# Patient Record
Sex: Female | Born: 1970 | Race: White | Hispanic: No | Marital: Married
Health system: Southern US, Community
[De-identification: ages and names within clinical notes are randomized; demographics above are authoritative.]

---

## 2007-08-01 ENCOUNTER — Inpatient Hospital Stay (HOSPITAL_COMMUNITY): Admission: AD | Admit: 2007-08-01 | Discharge: 2007-08-01 | Payer: Self-pay | Admitting: Obstetrics and Gynecology

## 2007-08-10 ENCOUNTER — Encounter (INDEPENDENT_AMBULATORY_CARE_PROVIDER_SITE_OTHER): Payer: Self-pay | Admitting: Obstetrics and Gynecology

## 2007-08-10 ENCOUNTER — Inpatient Hospital Stay (HOSPITAL_COMMUNITY): Admission: AD | Admit: 2007-08-10 | Discharge: 2007-08-13 | Payer: Self-pay | Admitting: Obstetrics and Gynecology

## 2007-08-15 ENCOUNTER — Inpatient Hospital Stay (HOSPITAL_COMMUNITY): Admission: AD | Admit: 2007-08-15 | Discharge: 2007-08-15 | Payer: Self-pay | Admitting: Obstetrics and Gynecology

## 2008-10-14 ENCOUNTER — Encounter: Admission: RE | Admit: 2008-10-14 | Discharge: 2008-10-14 | Payer: Self-pay | Admitting: Obstetrics and Gynecology

## 2008-12-27 ENCOUNTER — Encounter: Admission: RE | Admit: 2008-12-27 | Discharge: 2008-12-27 | Payer: Self-pay | Admitting: Endocrinology

## 2009-12-29 ENCOUNTER — Encounter: Admission: RE | Admit: 2009-12-29 | Discharge: 2009-12-29 | Payer: Self-pay | Admitting: Endocrinology

## 2010-01-07 ENCOUNTER — Encounter: Admission: RE | Admit: 2010-01-07 | Discharge: 2010-01-07 | Payer: Self-pay | Admitting: Obstetrics and Gynecology

## 2010-05-08 ENCOUNTER — Ambulatory Visit (HOSPITAL_COMMUNITY): Admission: RE | Admit: 2010-05-08 | Discharge: 2010-05-08 | Payer: Self-pay | Admitting: Gastroenterology

## 2010-09-20 ENCOUNTER — Encounter: Payer: Self-pay | Admitting: Gastroenterology

## 2010-11-26 ENCOUNTER — Other Ambulatory Visit: Payer: Self-pay | Admitting: Endocrinology

## 2010-11-26 DIAGNOSIS — E049 Nontoxic goiter, unspecified: Secondary | ICD-10-CM

## 2010-12-14 ENCOUNTER — Other Ambulatory Visit: Payer: Self-pay

## 2010-12-22 ENCOUNTER — Other Ambulatory Visit: Payer: Self-pay | Admitting: Obstetrics and Gynecology

## 2010-12-22 DIAGNOSIS — Z1231 Encounter for screening mammogram for malignant neoplasm of breast: Secondary | ICD-10-CM

## 2010-12-29 ENCOUNTER — Ambulatory Visit
Admission: RE | Admit: 2010-12-29 | Discharge: 2010-12-29 | Disposition: A | Discharge status: Home / Self Care | Payer: 59 | Source: Ambulatory Visit | Attending: Endocrinology | Admitting: Endocrinology

## 2010-12-29 DIAGNOSIS — E049 Nontoxic goiter, unspecified: Secondary | ICD-10-CM

## 2011-01-12 NOTE — Discharge Summary (Signed)
[name], Alicia Carter NO.:  [id number]   MEDICAL RECORD NO.:  [id number]          PATIENT TYPE:  INP   LOCATION:  9146                          FACILITY:  [facility]   PHYSICIAN:  Guy Sandifer. Henderson Cloud, M.D. DATE OF BIRTH:  1970/09/03   DATE OF ADMISSION:  08/10/2007  DATE OF DISCHARGE:  08/13/2007                               DISCHARGE SUMMARY   ADMITTING DIAGNOSES:  1. Intrauterine pregnancy at thirty-six and four-sevenths weeks.  2. Severe preeclampsia.   DISCHARGE DIAGNOSES:  1. Intrauterine pregnancy at thirty-six and four-sevenths weeks.  2. Severe preeclampsia.   PROCEDURE:  On August 10, 2007, repeat low transverse cesarean section  and bilateral tubal ligation.   REASON FOR ADMISSION:  This patient is a 40 year old married white  female, G2 P1, at 1 and 4/7 weeks who is noted to have increased  swelling and blood pressures.  Liver functions were also elevated.  She  was then taken to the operating room.   HOSPITAL COURSE:  The patient was taken to the operating room to undergo  the above procedure.  It was productive of a viable female infant, Apgars  of 9 and 10 at one and five minutes respectively.  On the first  postoperative day, the patient remains on magnesium sulfate.  She has  good pain relief with no central nervous system changes and no  epigastric pain.  Blood pressures are 120s-140s/70s-80s with good urine  output.  Laboratories revealed a mildly elevated AST of 50 and ALT of  40.  Hemoglobin is 7.4.  Repeat lab work later in the day reveals a  stable hemoglobin and slightly improved liver functions.  She was  continued on magnesium through the night and by the next morning is  complaining of hot flashes and some general weakness with the magnesium.  The magnesium is discontinued and the patient feels remarkably better.  She is tolerating a regular diet and passing flatus.  She had some  labile blood pressures up and down through the night and  was treated one  time with labetalol IV.  Later in the morning on August 12, 2007,  blood pressures are 130s to 150s/80s to 90s.  Lungs remain clear,  hemoglobin is 6.6, platelets are stable at 269 and liver functions  continue to improve slightly at an AST of 38 and an ALT of 34.  ON the  day of discharge she is feeling fine.  Blood pressures are 120s to  160s/70s and 80s.  Lungs remain clear.  Her incision is healing well.  Hemoglobin is stable at 6.4, platelets are 297,000.  AST is mildly  elevated at 41 and ALT is normal at 26.   CONDITION ON DISCHARGE:  Good.   DIET:  Regular as tolerated.   ACTIVITY:  1. No lifting.  2. No operation of automobiles.  3. No vaginal entry.  4. Increased rest is discussed.  5. Signs and symptoms of preeclampsia are reviewed.  She is to call      the office for problems including, but not limited to, heavy      vaginal bleeding, temperature  of 101 degrees or increasing pain or      persistent nausea or vomiting.   MEDICATIONS:  1. Tandem iron supplement, #60, one p.o. b.i.d.  2. Darvocet-N 100, #30, 1-2 p.o. q.6 hours p.r.n.  3. Motrin 600 mg, #30, no refills, one p.o. q.6h. p.r.n.   FOLLOWUP:  In the office in 5 days.      Guy Sandifer Henderson Cloud, M.D.  Electronically Signed     JET/MEDQ  D:  08/13/2007  T:  08/14/2007  Job:  [id number]

## 2011-01-12 NOTE — Op Note (Signed)
Alicia Carter, Alicia Carter             ACCOUNT NO.:  [id number]   MEDICAL RECORD NO.:  [id number]          PATIENT TYPE:  INP   LOCATION:  9373                          FACILITY:  [facility]   PHYSICIAN:  Michelle L. Grewal, M.D.DATE OF BIRTH:  Jun 15, 1971   DATE OF PROCEDURE:  08/10/2007  DATE OF DISCHARGE:                               OPERATIVE REPORT   PREOPERATIVE DIAGNOSES:  1. Intrauterine pregnancy 36 and 4.  2. Previous cesarean section.  3. Severe preeclampsia.  4. Desires permanent sterilization.   POSTOPERATIVE DIAGNOSES:  1. Intrauterine pregnancy 36 and 4.  2. Previous cesarean section.  3. Severe preeclampsia.  4. Desires permanent sterilization.   PROCEDURE:  Repeat low transverse cesarean section, and modified [place]  bilateral tubal ligation.   SURGEON:  Michelle L. Vincente Poli, M.D.   ANESTHESIA:  Spinal.   FINDINGS:  Female infant in cephalic presentation, Apgars 9 at one minute  and 10 at five minutes.   PATHOLOGY:  Fallopian tube segments and placenta.   ESTIMATED BLOOD LOSS:  500 mL.   DRAINS:  Foley.   PROCEDURE:  The patient was taken to the operating room.  Her spinal was  placed and found to be adequate.  This was placed by Dr. Tacy Dura.  She  was prepped and draped, and a Foley catheter was inserted and draining  clear urine.  A low transverse incision was made, carried down to the  fascia, fascia scored in the midline and extended laterally.  The rectus  muscles were separated in the midline.  The peritoneum was entered  bluntly, and the peritoneal incision was then stretched.  The bladder  blade was inserted, and the lower uterine segment was identified, and  the bladder flap was created sharply and then digitally.  The bladder  blade was readjusted.  A low transverse incision was made in the uterus.  The baby was in cephalic presentation and was delivered easily with one  gentle pull of the vacuum with no pop-off.  The baby was a female infant,  vigorous on  the abdomen, with Apgars 9 at one minute and 10 at five  minutes.  The cord had been clamped and cut and was handed to the  waiting neonatal team, and the baby was subsequently taken to newborn  nursery.  The placenta was manually removed after cord blood was  obtained, and the placenta was noted be normal, intact, with three-  vessel cord.  Antibiotics and Pitocin had been given.  The uterus was  exteriorized and cleared of all clots and debris. The uterus became  firm, and the incision was closed in one layer using 0 chromic in  continuous running-locked stitch.  Hemostasis was excellent.  At this  point, the patient had wanted to have permanent sterilization and had  been counseled prior to this in regards to that and had consented to a  tubal ligation.  At this point, we identified each fallopian tube  segment out to its fimbriated end on each tubal segment, lifted up the  midportion of the tube using a Babcock clamp, tied off about a 2-3 cm  knuckle using plain gut x2, and excised each segment of fallopian tube  with Metzenbaums.  Hemostasis was excellent.  The uterus was returned to  the abdomen.  Irrigation was performed.  Hemostasis was again noted.  The rectus muscles and peritoneum were closed using 0 Vicryl.  The  fascia was closed using 0 Vicryl in a running stitch.  After irrigation  of the subcutaneous layer, the skin was closed with staples.  All  sponge, lap, and instrument counts were correct x2.  The patient went to  the recovery room in stable condition.      Michelle L. Vincente Poli, M.D.  Electronically Signed     MLG/MEDQ  D:  08/10/2007  T:  08/11/2007  Job:  [id number]

## 2011-01-13 ENCOUNTER — Ambulatory Visit: Payer: Self-pay

## 2011-02-08 ENCOUNTER — Ambulatory Visit: Payer: 59

## 2011-05-10 ENCOUNTER — Ambulatory Visit
Admission: RE | Admit: 2011-05-10 | Discharge: 2011-05-10 | Disposition: A | Discharge status: Home / Self Care | Payer: 59 | Source: Ambulatory Visit | Attending: Obstetrics and Gynecology | Admitting: Obstetrics and Gynecology

## 2011-05-10 DIAGNOSIS — Z1231 Encounter for screening mammogram for malignant neoplasm of breast: Secondary | ICD-10-CM

## 2011-06-07 LAB — CBC
HCT: 19.3 — ABNORMAL LOW
HCT: 21.4 — ABNORMAL LOW
HCT: 22.3 — ABNORMAL LOW
HCT: 30.7 — ABNORMAL LOW
HCT: 31 — ABNORMAL LOW
Hemoglobin: 10.5 — ABNORMAL LOW
Hemoglobin: 6.6 — CL
Hemoglobin: 7.4 — CL
MCHC: 33.7
MCHC: 33.8
MCHC: 34.1
MCV: 77.5 — ABNORMAL LOW
MCV: 77.6 — ABNORMAL LOW
MCV: 78.2
MCV: 78.9
Platelets: 275
Platelets: 283
Platelets: 297
RBC: 2.51 — ABNORMAL LOW
RBC: 2.85 — ABNORMAL LOW
RBC: 3.95
RDW: 14.5
RDW: 14.5
RDW: 14.6
RDW: 15.1
RDW: 15.1
WBC: 12.1 — ABNORMAL HIGH
WBC: 13.8 — ABNORMAL HIGH
WBC: 15.4 — ABNORMAL HIGH
WBC: 16.5 — ABNORMAL HIGH
WBC: 9.6

## 2011-06-07 LAB — COMPREHENSIVE METABOLIC PANEL
ALT: 31
ALT: 34
ALT: 47 — ABNORMAL HIGH
AST: 28
AST: 41 — ABNORMAL HIGH
AST: 53 — ABNORMAL HIGH
Albumin: 1.9 — ABNORMAL LOW
Albumin: 2.1 — ABNORMAL LOW
Albumin: 2.5 — ABNORMAL LOW
Albumin: 2.6 — ABNORMAL LOW
Alkaline Phosphatase: 123 — ABNORMAL HIGH
Alkaline Phosphatase: 167 — ABNORMAL HIGH
Alkaline Phosphatase: 183 — ABNORMAL HIGH
BUN: 5 — ABNORMAL LOW
BUN: 6
CO2: 25
CO2: 26
Calcium: 5.8 — CL
Calcium: 6.1 — CL
Chloride: 102
Chloride: 106
Chloride: 107
Creatinine, Ser: 0.91
Creatinine, Ser: 0.94
Creatinine, Ser: 0.95
GFR calc Af Amer: >60
GFR calc Af Amer: >60
GFR calc Af Amer: >60
GFR calc Af Amer: >60
GFR calc non Af Amer: >60
GFR calc non Af Amer: >60
GFR calc non Af Amer: >60
Glucose, Bld: 101 — ABNORMAL HIGH
Glucose, Bld: 130 — ABNORMAL HIGH
Glucose, Bld: 138 — ABNORMAL HIGH
Glucose, Bld: 144 — ABNORMAL HIGH
Potassium: 4
Potassium: 4
Potassium: 4
Potassium: 4.3
Sodium: 133 — ABNORMAL LOW
Sodium: 137
Sodium: 137
Sodium: 137
Total Bilirubin: 0.4
Total Bilirubin: 0.4
Total Bilirubin: 0.5
Total Bilirubin: 0.6
Total Protein: 4.5 — ABNORMAL LOW
Total Protein: 4.6 — ABNORMAL LOW
Total Protein: 5 — ABNORMAL LOW

## 2011-06-07 LAB — LACTATE DEHYDROGENASE
LDH: 321 — ABNORMAL HIGH
LDH: 329 — ABNORMAL HIGH
LDH: 344 — ABNORMAL HIGH

## 2011-06-07 LAB — URIC ACID
Uric Acid, Serum: 6.6
Uric Acid, Serum: 6.9
Uric Acid, Serum: 7.4 — ABNORMAL HIGH

## 2011-06-07 LAB — MAGNESIUM
Magnesium: 3.3 — ABNORMAL HIGH
Magnesium: 6 — ABNORMAL HIGH
Magnesium: 6.2

## 2011-10-06 ENCOUNTER — Other Ambulatory Visit: Payer: Self-pay | Admitting: Dermatology

## 2012-04-06 ENCOUNTER — Other Ambulatory Visit: Payer: Self-pay | Admitting: Obstetrics and Gynecology

## 2012-04-06 DIAGNOSIS — N644 Mastodynia: Secondary | ICD-10-CM

## 2012-04-26 ENCOUNTER — Other Ambulatory Visit: Payer: Self-pay | Admitting: Endocrinology

## 2012-04-26 DIAGNOSIS — E049 Nontoxic goiter, unspecified: Secondary | ICD-10-CM

## 2012-05-03 ENCOUNTER — Other Ambulatory Visit: Payer: 59

## 2012-05-09 ENCOUNTER — Ambulatory Visit
Admission: RE | Admit: 2012-05-09 | Discharge: 2012-05-09 | Disposition: A | Discharge status: Home / Self Care | Payer: 59 | Source: Ambulatory Visit | Attending: Obstetrics and Gynecology | Admitting: Obstetrics and Gynecology

## 2012-05-09 DIAGNOSIS — N644 Mastodynia: Secondary | ICD-10-CM

## 2012-05-10 ENCOUNTER — Ambulatory Visit
Admission: RE | Admit: 2012-05-10 | Discharge: 2012-05-10 | Disposition: A | Discharge status: Home / Self Care | Payer: 59 | Source: Ambulatory Visit | Attending: Endocrinology | Admitting: Endocrinology

## 2012-05-10 DIAGNOSIS — E049 Nontoxic goiter, unspecified: Secondary | ICD-10-CM

## 2012-08-17 ENCOUNTER — Other Ambulatory Visit: Payer: Self-pay

## 2012-11-02 ENCOUNTER — Other Ambulatory Visit: Payer: Self-pay | Admitting: Endocrinology

## 2012-11-02 DIAGNOSIS — E049 Nontoxic goiter, unspecified: Secondary | ICD-10-CM

## 2012-12-29 ENCOUNTER — Other Ambulatory Visit: Payer: Self-pay | Admitting: Obstetrics and Gynecology

## 2013-03-27 ENCOUNTER — Other Ambulatory Visit: Payer: Self-pay

## 2013-03-27 DIAGNOSIS — Z1231 Encounter for screening mammogram for malignant neoplasm of breast: Secondary | ICD-10-CM

## 2013-05-04 ENCOUNTER — Ambulatory Visit
Admission: RE | Admit: 2013-05-04 | Discharge: 2013-05-04 | Disposition: A | Discharge status: Home / Self Care | Payer: BC Managed Care – PPO | Source: Ambulatory Visit

## 2013-05-04 ENCOUNTER — Ambulatory Visit: Payer: 59

## 2013-05-04 DIAGNOSIS — Z1231 Encounter for screening mammogram for malignant neoplasm of breast: Secondary | ICD-10-CM

## 2013-06-04 ENCOUNTER — Ambulatory Visit
Admission: RE | Admit: 2013-06-04 | Discharge: 2013-06-04 | Disposition: A | Discharge status: Home / Self Care | Payer: BC Managed Care – PPO | Source: Ambulatory Visit | Attending: Endocrinology | Admitting: Endocrinology

## 2013-06-04 DIAGNOSIS — E049 Nontoxic goiter, unspecified: Secondary | ICD-10-CM

## 2013-06-18 IMAGING — US US SOFT TISSUE HEAD/NECK
1 series · 14 of 25 positions shown · non-contrast
Comparison: Ultrasound of the thyroid of 12/29/2010

CLINICAL DATA: Follow up of thyroid goiter

THYROID ULTRASOUND
TECHNIQUE: Ultrasound examination of the thyroid gland and adjacent
soft tissues was performed.

[Series 1: us soft tissue head/neck · 0.07mm/px · 14 of 64 slices shown]
[im 1/64]
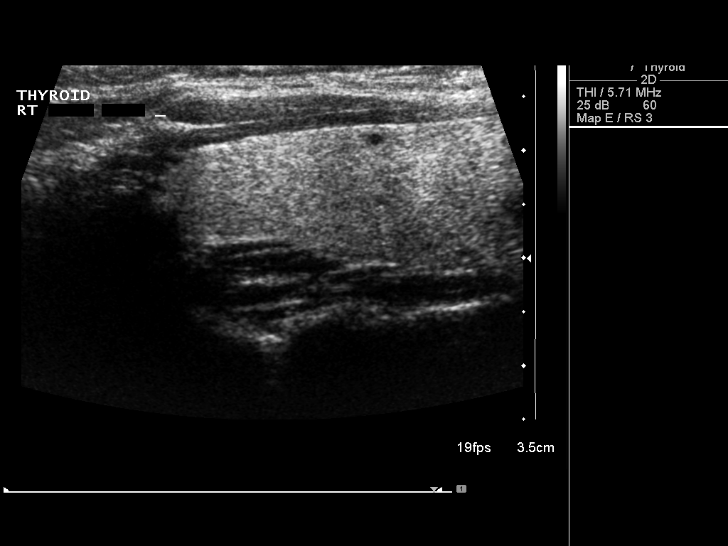
[im 6/64]
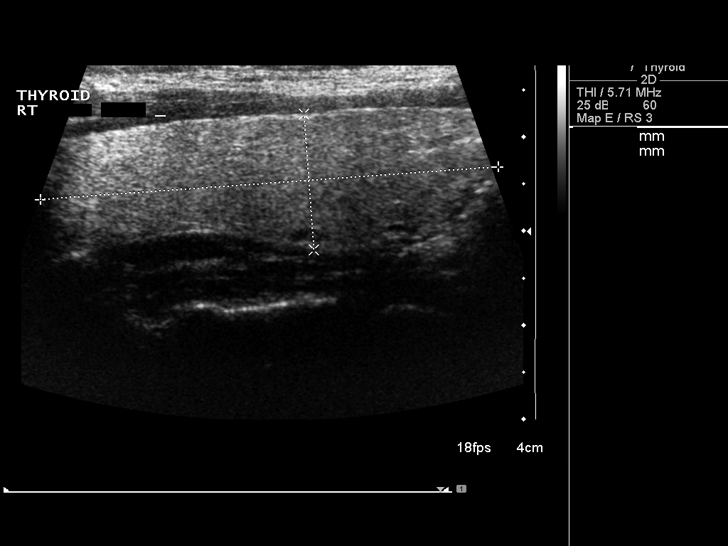
[im 11/64]
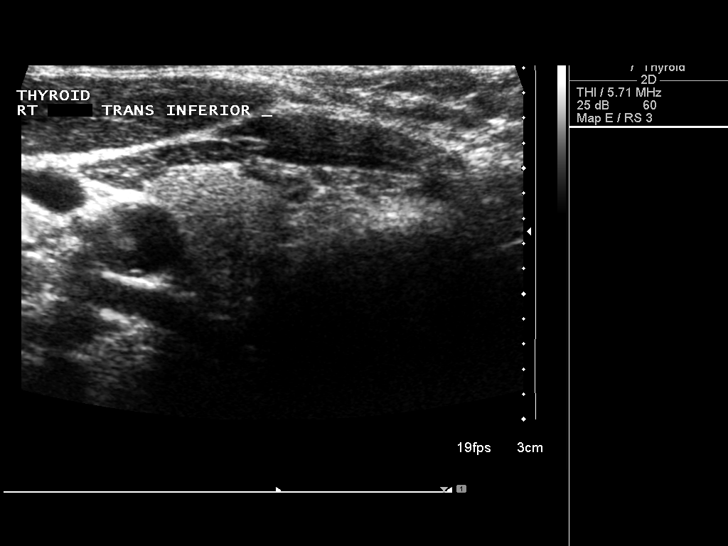
[im 16/64]
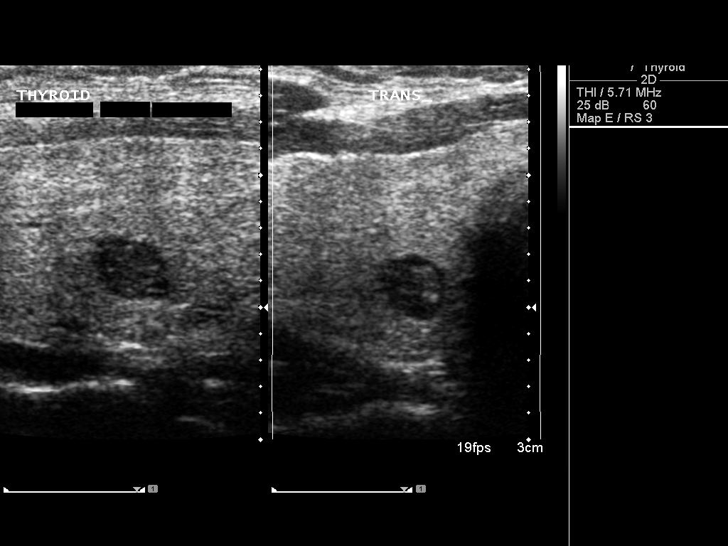
[im 22/64]
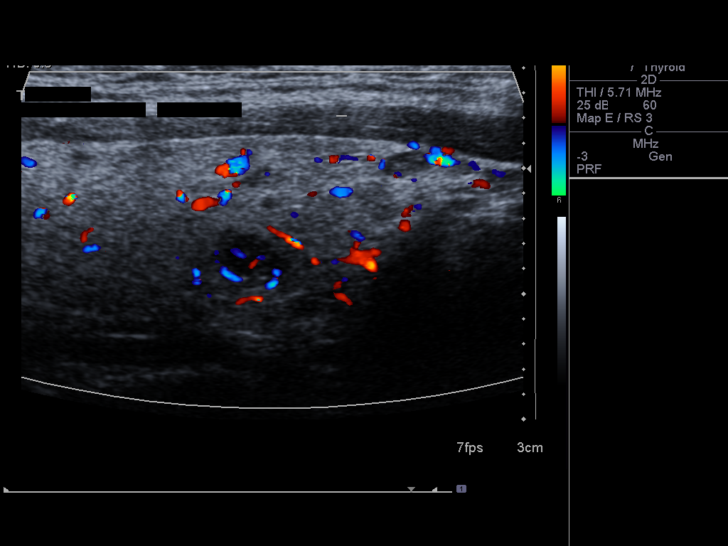
[im 24/64]
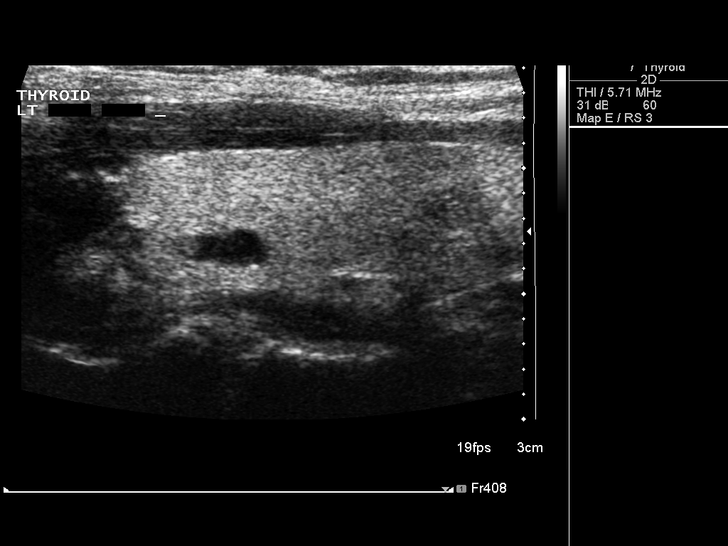
[im 29/64]
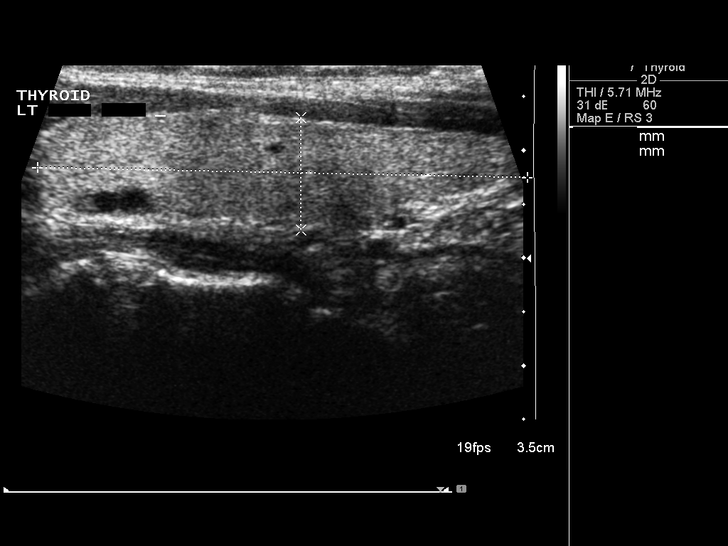
[im 35/64]
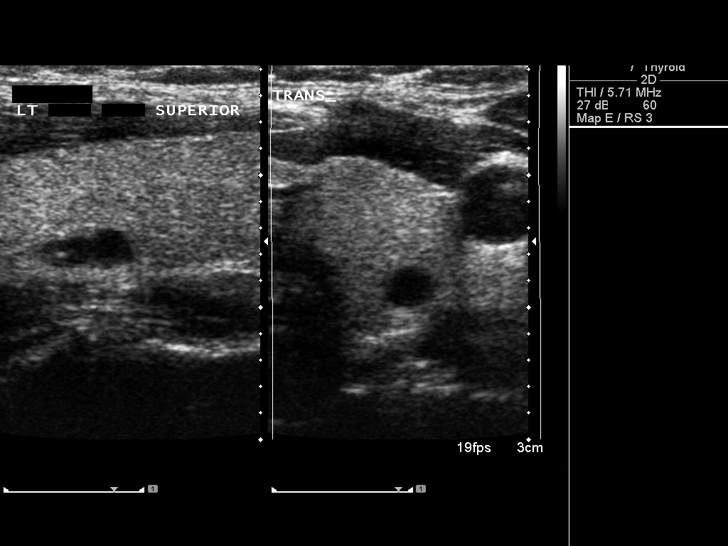
[im 40/64]
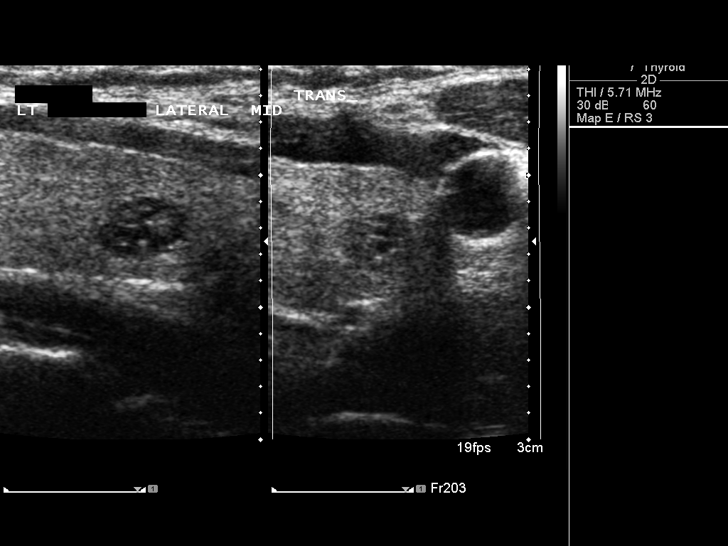
[im 43/64]
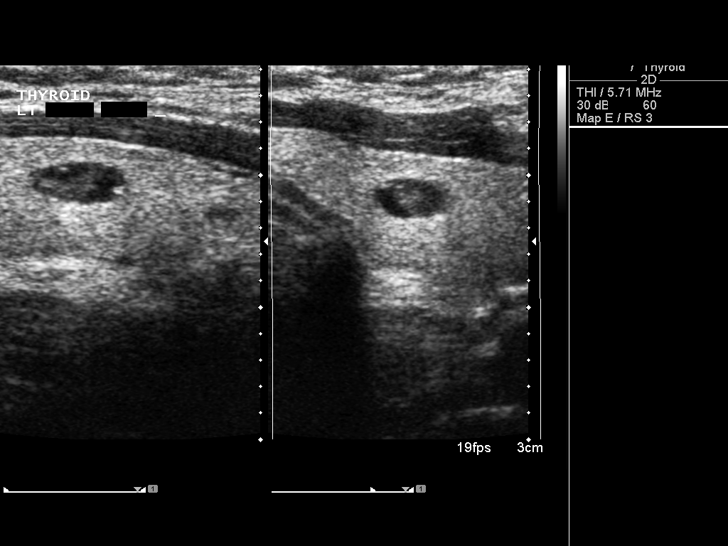
[im 48/64]
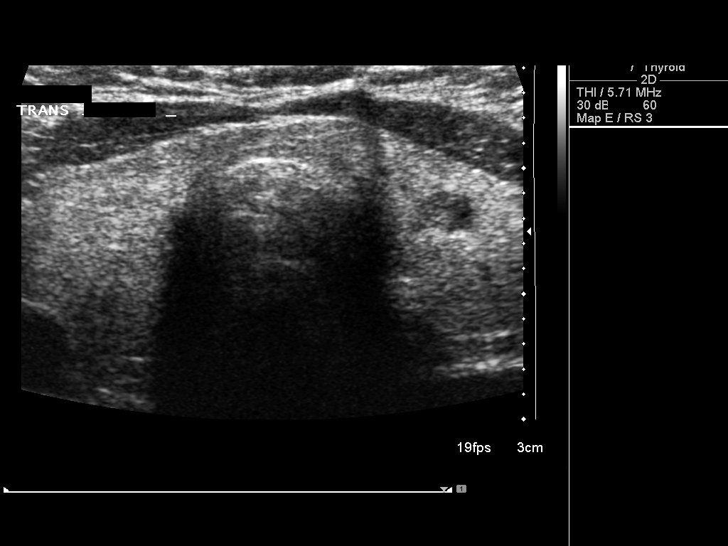
[im 53/64]
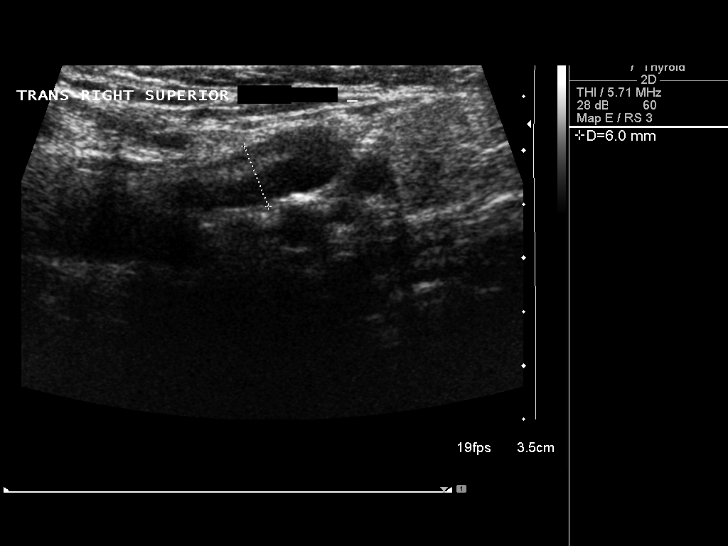
[im 58/64]
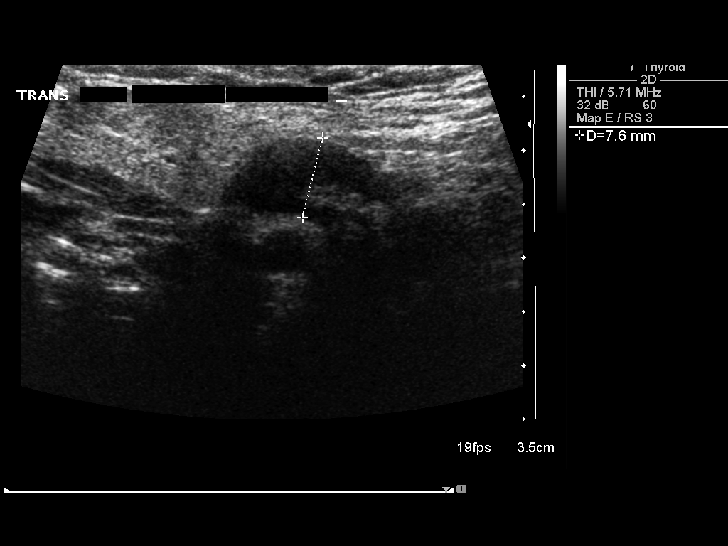
[im 64/64]
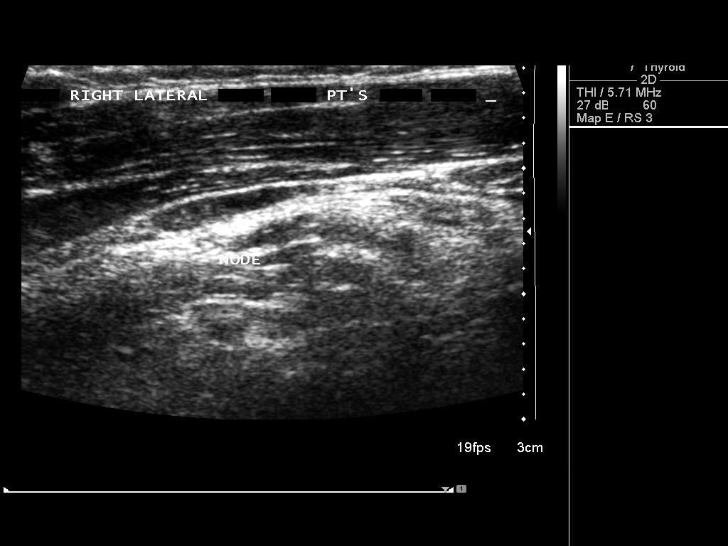

[14 of 25 positions shown; findings below may reference images not displayed]

FINDINGS: Right thyroid lobe:  5.4 x 1.4 x 1.8 cm.  (Previously 5.1 x 1.6 x
1.8 cm).
Left thyroid lobe:  4.8 x 1.1 x 1.9 cm.  (Previously 5.3 x 1.2 x
2.0 cm).
Isthmus:  2 mm in thickness.

Focal nodules:  The echogenicity of the thyroid gland is relatively
homogeneous.  However multiple nodules, primarily hypoechoic are
again noted bilaterally.  A solid nodule in the lower pole on the
left measures 0.7 x 0.5 x 0.6 cm compared to prior measurements of
0.7 x 0.5 x 0.7 cm with a few microcalcifications present.

Lymphadenopathy:  No adenopathy is seen.

Imaging over the area of the patient's palpable area in the right
neck was performed.  Only small lymph nodes are present with no
mass or cyst evident.
IMPRESSION: Stable thyroid nodules, the majority of which are hypoechoic.

## 2013-08-06 ENCOUNTER — Other Ambulatory Visit: Payer: 59

## 2013-08-13 ENCOUNTER — Other Ambulatory Visit: Payer: Self-pay | Admitting: Endocrinology

## 2013-08-13 DIAGNOSIS — E049 Nontoxic goiter, unspecified: Secondary | ICD-10-CM

## 2014-01-01 ENCOUNTER — Other Ambulatory Visit: Payer: Self-pay | Admitting: Obstetrics and Gynecology

## 2014-04-01 ENCOUNTER — Other Ambulatory Visit: Payer: Self-pay

## 2014-04-01 DIAGNOSIS — Z1231 Encounter for screening mammogram for malignant neoplasm of breast: Secondary | ICD-10-CM

## 2014-05-13 ENCOUNTER — Ambulatory Visit
Admission: RE | Admit: 2014-05-13 | Discharge: 2014-05-13 | Disposition: A | Discharge status: Home / Self Care | Payer: BC Managed Care – PPO | Source: Ambulatory Visit

## 2014-05-13 DIAGNOSIS — Z1231 Encounter for screening mammogram for malignant neoplasm of breast: Secondary | ICD-10-CM

## 2014-05-15 ENCOUNTER — Other Ambulatory Visit: Payer: Self-pay | Admitting: Obstetrics and Gynecology

## 2014-05-15 DIAGNOSIS — R928 Other abnormal and inconclusive findings on diagnostic imaging of breast: Secondary | ICD-10-CM

## 2014-05-22 ENCOUNTER — Ambulatory Visit
Admission: RE | Admit: 2014-05-22 | Discharge: 2014-05-22 | Disposition: A | Discharge status: Home / Self Care | Payer: BC Managed Care – PPO | Source: Ambulatory Visit | Attending: Obstetrics and Gynecology | Admitting: Obstetrics and Gynecology

## 2014-05-22 DIAGNOSIS — R928 Other abnormal and inconclusive findings on diagnostic imaging of breast: Secondary | ICD-10-CM

## 2014-08-13 ENCOUNTER — Ambulatory Visit
Admission: RE | Admit: 2014-08-13 | Discharge: 2014-08-13 | Disposition: A | Discharge status: Home / Self Care | Payer: BC Managed Care – PPO | Source: Ambulatory Visit | Attending: Endocrinology | Admitting: Endocrinology

## 2014-08-13 DIAGNOSIS — E049 Nontoxic goiter, unspecified: Secondary | ICD-10-CM

## 2014-09-10 ENCOUNTER — Other Ambulatory Visit: Payer: Self-pay | Admitting: Obstetrics and Gynecology

## 2014-09-10 DIAGNOSIS — N644 Mastodynia: Secondary | ICD-10-CM

## 2014-09-10 DIAGNOSIS — N63 Unspecified lump in unspecified breast: Secondary | ICD-10-CM

## 2014-11-21 ENCOUNTER — Ambulatory Visit
Admission: RE | Admit: 2014-11-21 | Discharge: 2014-11-21 | Disposition: A | Discharge status: Home / Self Care | Payer: BLUE CROSS/BLUE SHIELD | Source: Ambulatory Visit | Attending: Obstetrics and Gynecology | Admitting: Obstetrics and Gynecology

## 2014-11-21 DIAGNOSIS — N644 Mastodynia: Secondary | ICD-10-CM

## 2014-11-21 DIAGNOSIS — N63 Unspecified lump in unspecified breast: Secondary | ICD-10-CM

## 2015-01-23 ENCOUNTER — Other Ambulatory Visit: Payer: Self-pay | Admitting: Endocrinology

## 2015-01-23 DIAGNOSIS — E049 Nontoxic goiter, unspecified: Secondary | ICD-10-CM

## 2015-02-17 ENCOUNTER — Other Ambulatory Visit: Payer: Self-pay | Admitting: Obstetrics and Gynecology

## 2015-02-18 LAB — CYTOLOGY - PAP

## 2015-04-07 ENCOUNTER — Other Ambulatory Visit: Payer: Self-pay | Admitting: Obstetrics and Gynecology

## 2015-04-07 DIAGNOSIS — N6002 Solitary cyst of left breast: Secondary | ICD-10-CM

## 2015-05-16 ENCOUNTER — Ambulatory Visit
Admission: RE | Admit: 2015-05-16 | Discharge: 2015-05-16 | Disposition: A | Discharge status: Home / Self Care | Payer: BLUE CROSS/BLUE SHIELD | Source: Ambulatory Visit | Attending: Obstetrics and Gynecology | Admitting: Obstetrics and Gynecology

## 2015-05-16 DIAGNOSIS — N6002 Solitary cyst of left breast: Secondary | ICD-10-CM

## 2015-08-13 ENCOUNTER — Ambulatory Visit
Admission: RE | Admit: 2015-08-13 | Discharge: 2015-08-13 | Disposition: A | Discharge status: Home / Self Care | Payer: BLUE CROSS/BLUE SHIELD | Source: Ambulatory Visit | Attending: Endocrinology | Admitting: Endocrinology

## 2015-08-13 DIAGNOSIS — E049 Nontoxic goiter, unspecified: Secondary | ICD-10-CM

## 2015-09-21 IMAGING — US US SOFT TISSUE HEAD/NECK
1 series · 14 of 25 positions shown · non-contrast
Comparison: 06/04/2013

CLINICAL DATA: Goiter

EXAM:
THYROID ULTRASOUND
TECHNIQUE: Ultrasound examination of the thyroid gland and adjacent soft
tissues was performed.

[Series 1: us soft tissue head/neck · 0.06mm/px · 14 of 51 slices shown]
[im 1/51]
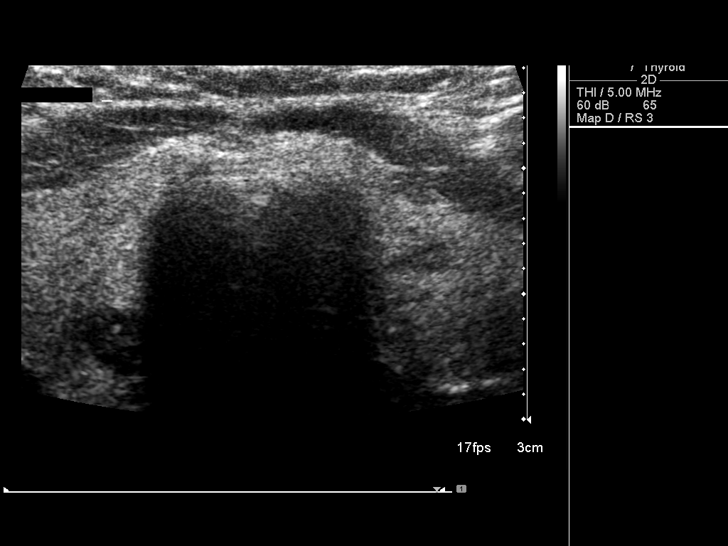
[im 5/51]
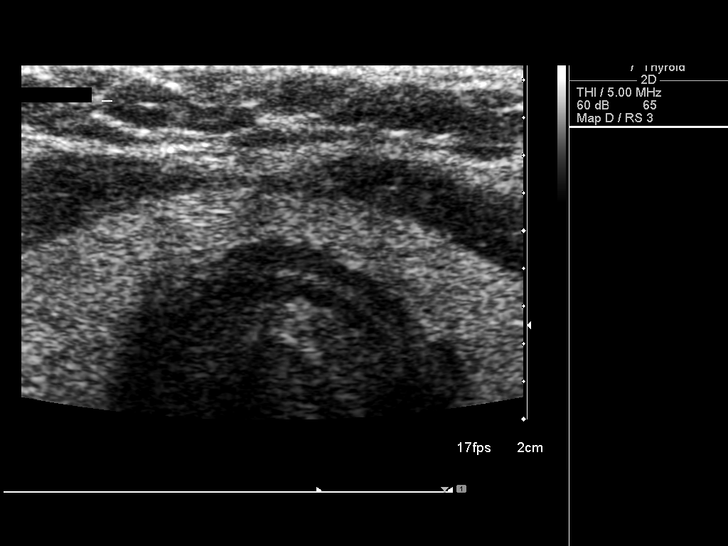
[im 9/51]
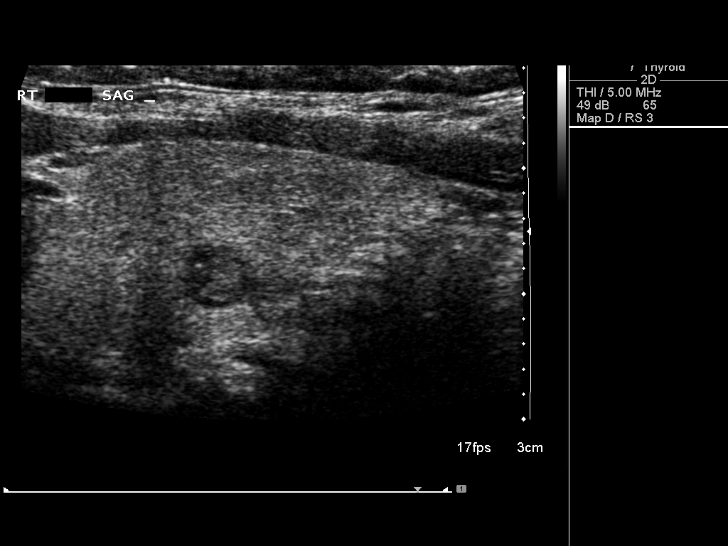
[im 13/51]
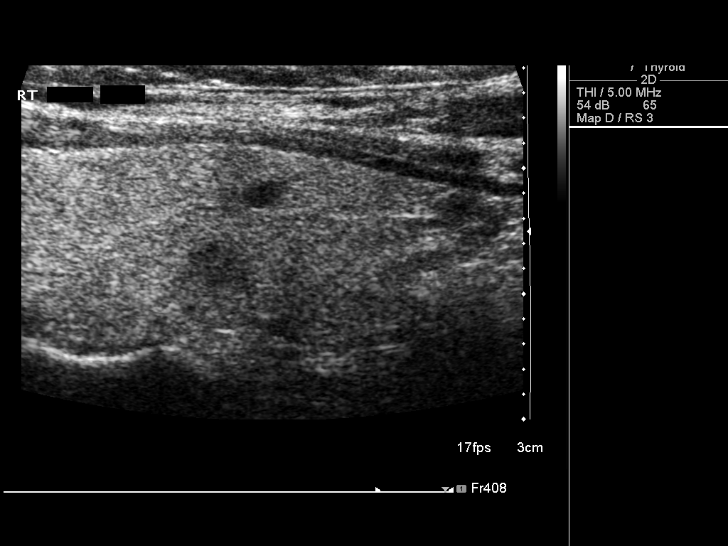
[im 17/51]
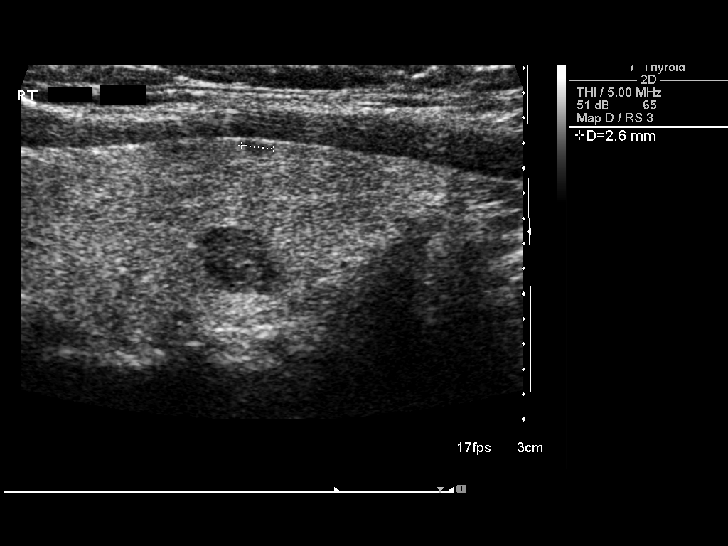
[im 19/51]
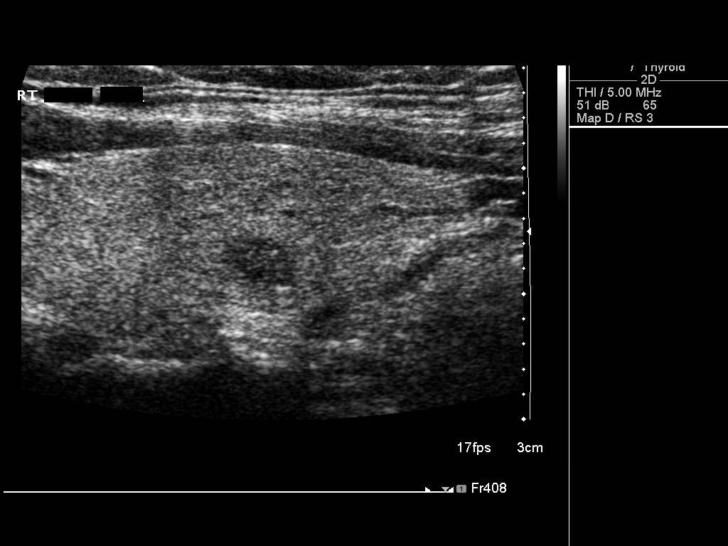
[im 23/51]
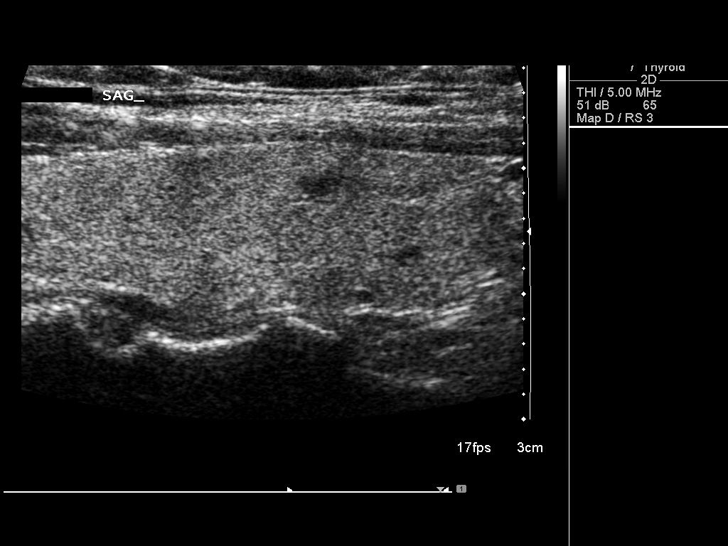
[im 28/51]
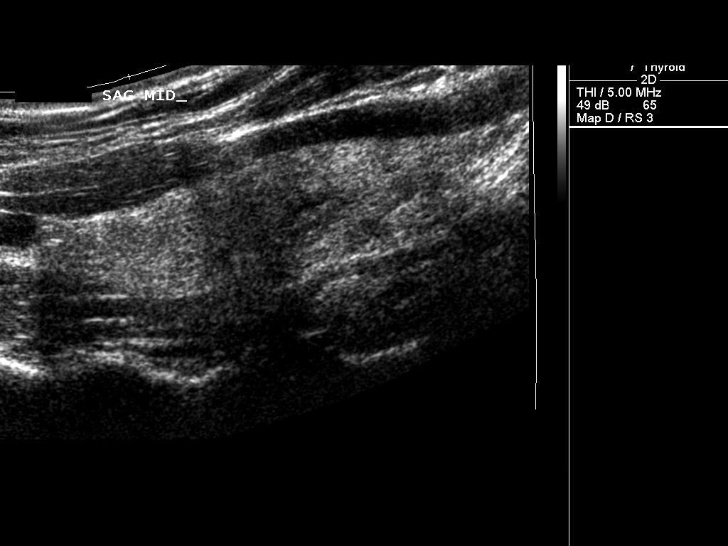
[im 32/51]
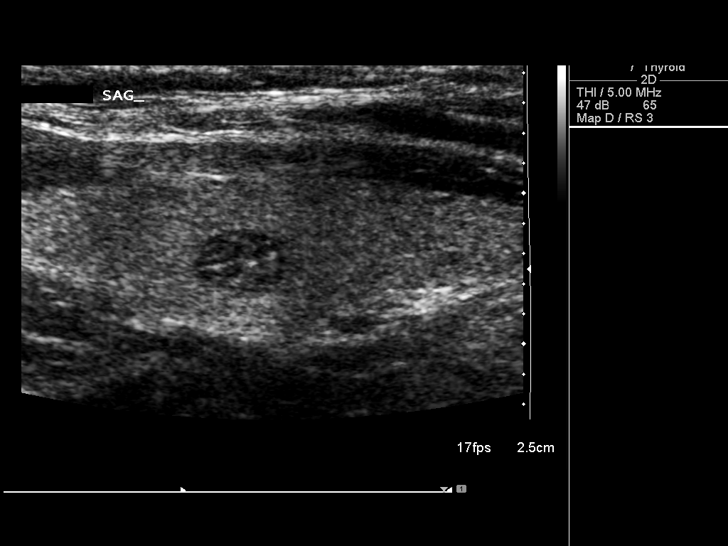
[im 34/51]
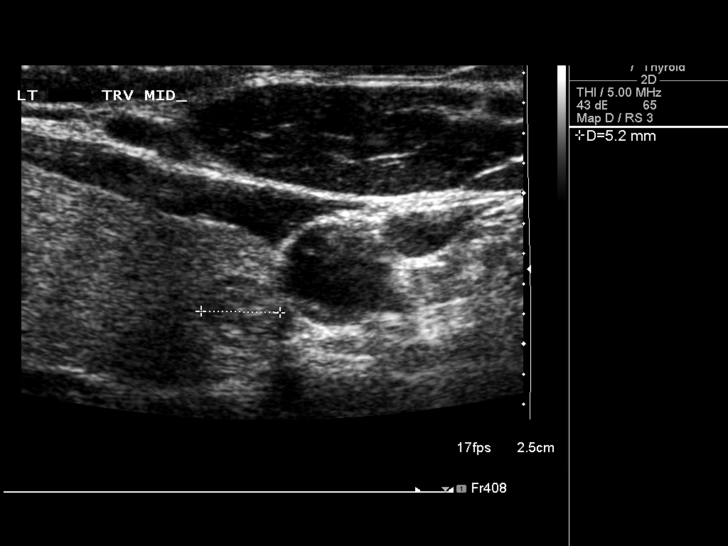
[im 38/51]
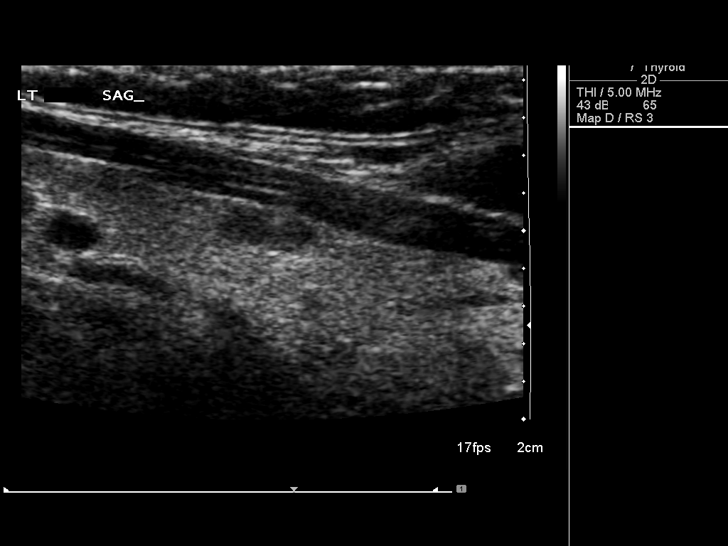
[im 42/51]
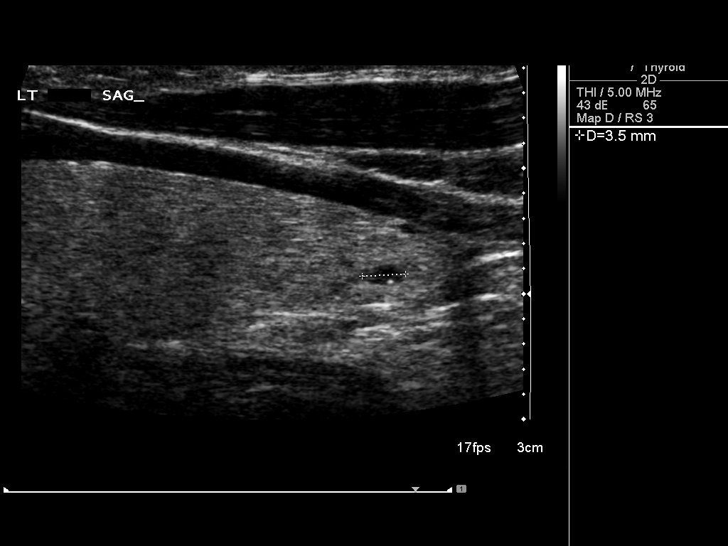
[im 46/51]
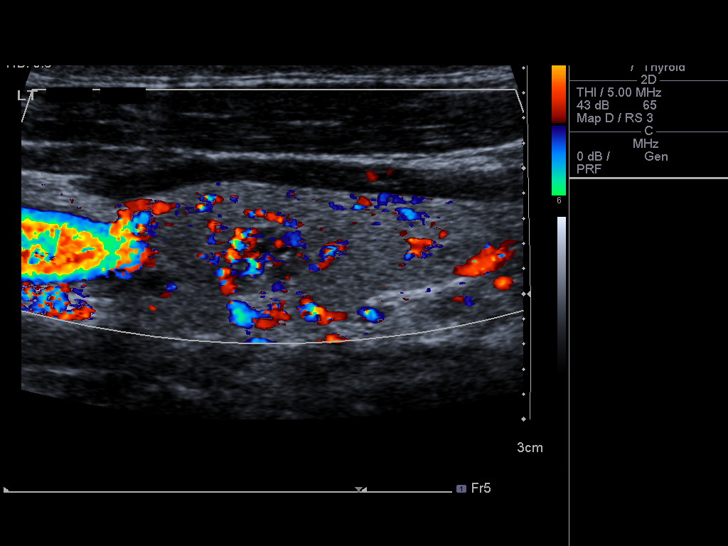
[im 51/51]
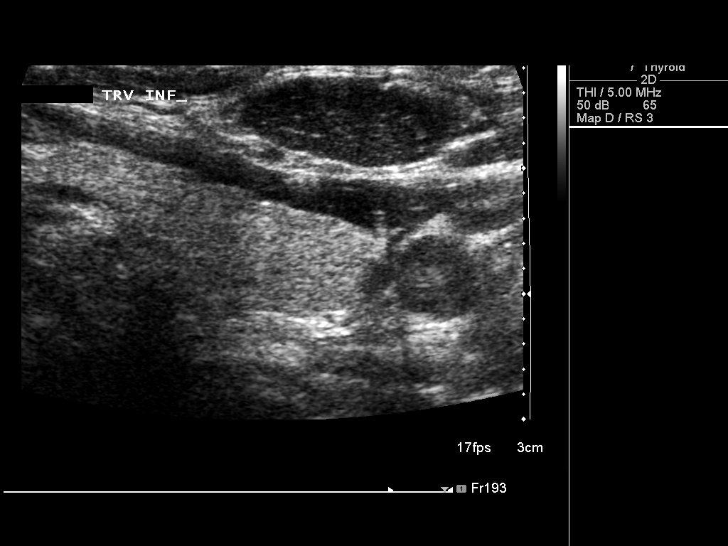

[14 of 25 positions shown; findings below may reference images not displayed]

FINDINGS: Right thyroid lobe

Measurements: 5.4 x 1.8 x 2.1 cm. 7 mm lower pole nodule with
punctate calcifications previously measured 8 mm.

Left thyroid lobe

Measurements: 4.9 x 1.1 x 2.1 cm. Several small nodules are present.
The largest is in the lower pole measuring 7 mm with punctate
calcifications. This is not significantly changed.

Isthmus

Thickness: 3 mm.  No nodules visualized.

Lymphadenopathy

None visualized.
IMPRESSION: Sub cm nodules with punctate calcifications are not significantly
changed.

## 2015-10-13 ENCOUNTER — Other Ambulatory Visit: Payer: Self-pay | Admitting: Obstetrics and Gynecology

## 2015-10-13 DIAGNOSIS — N644 Mastodynia: Secondary | ICD-10-CM

## 2015-10-17 ENCOUNTER — Ambulatory Visit
Admission: RE | Admit: 2015-10-17 | Discharge: 2015-10-17 | Disposition: A | Discharge status: Home / Self Care | Payer: BLUE CROSS/BLUE SHIELD | Source: Ambulatory Visit | Attending: Obstetrics and Gynecology | Admitting: Obstetrics and Gynecology

## 2015-10-17 DIAGNOSIS — N644 Mastodynia: Secondary | ICD-10-CM

## 2016-09-20 IMAGING — US US SOFT TISSUE HEAD/NECK
1 series · 14 of 25 positions shown · non-contrast
Comparison: 08/13/2014 and previous

CLINICAL DATA: Goiter

EXAM:
THYROID ULTRASOUND
TECHNIQUE: Ultrasound examination of the thyroid gland and adjacent soft
tissues was performed.

[Series 1: us soft tissue head/neck · 0.07mm/px · 14 of 56 slices shown]
[im 1/56]
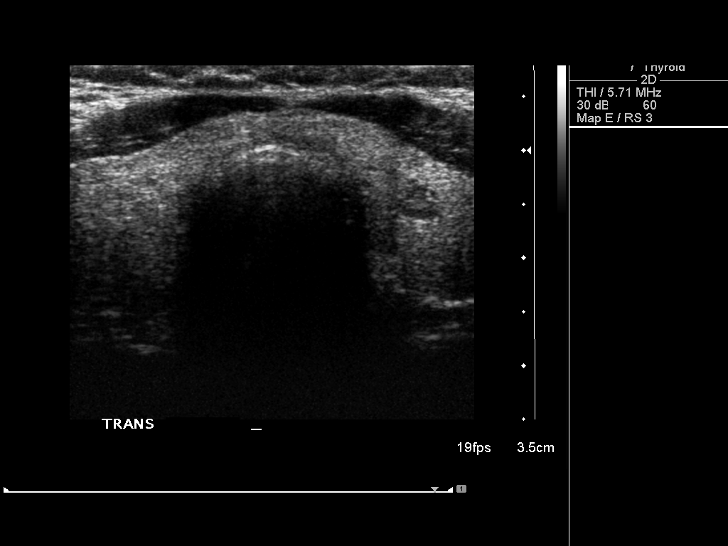
[im 5/56]
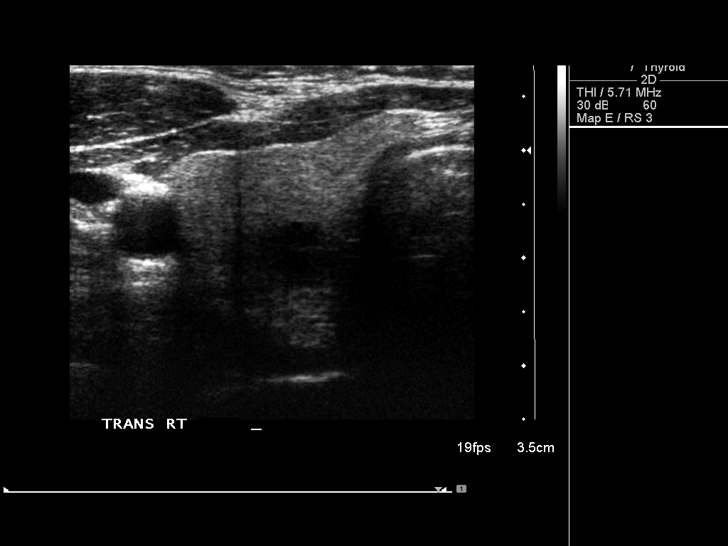
[im 10/56]
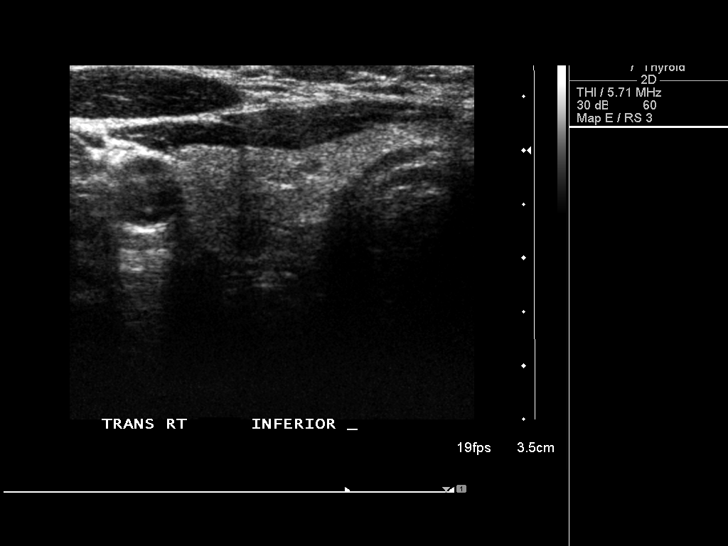
[im 14/56]
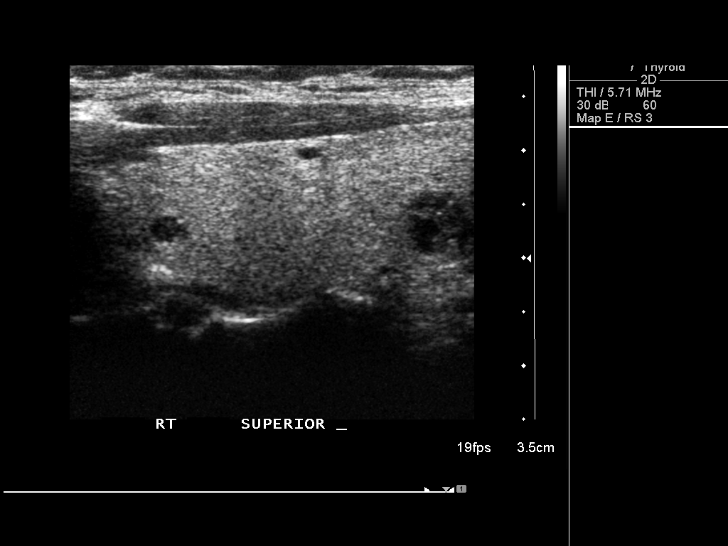
[im 19/56]
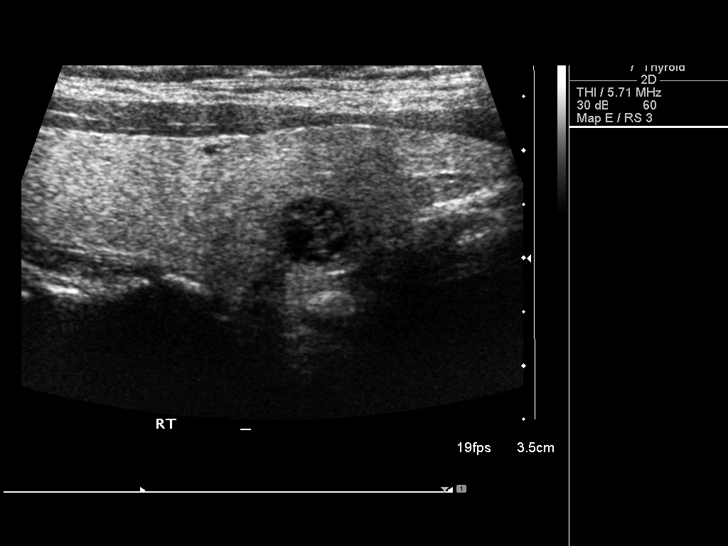
[im 21/56]
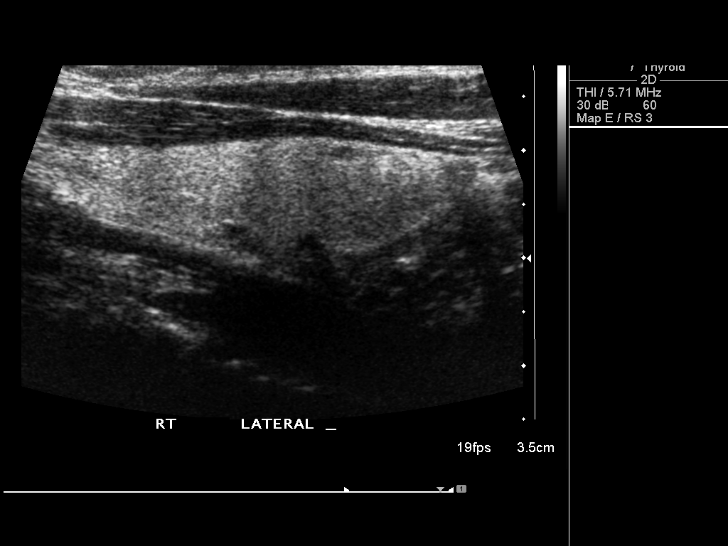
[im 26/56]
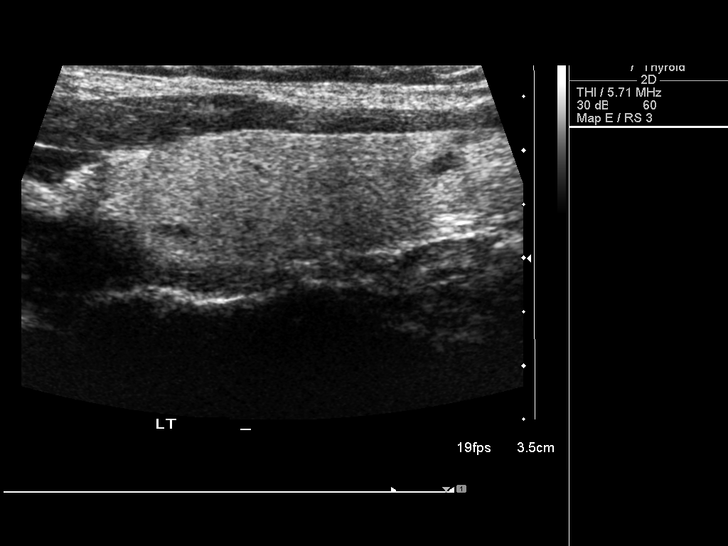
[im 30/56]
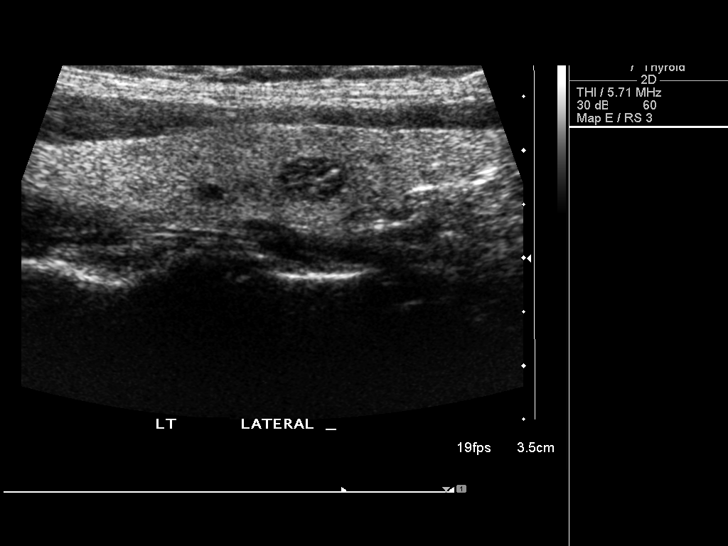
[im 35/56]
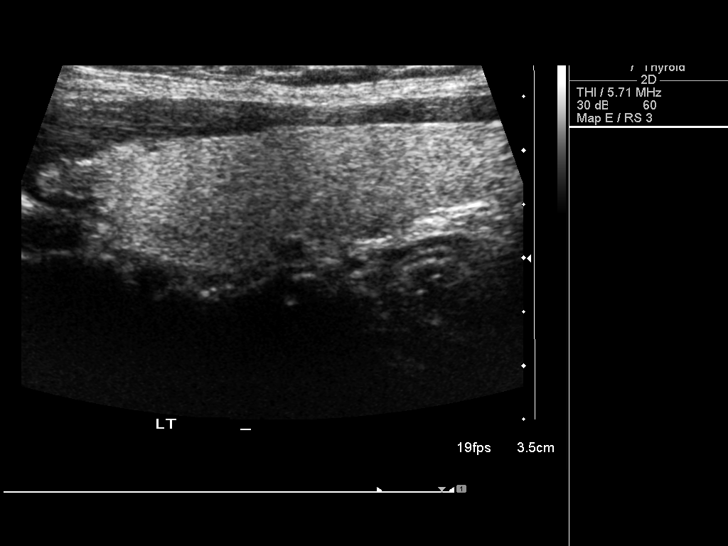
[im 37/56]
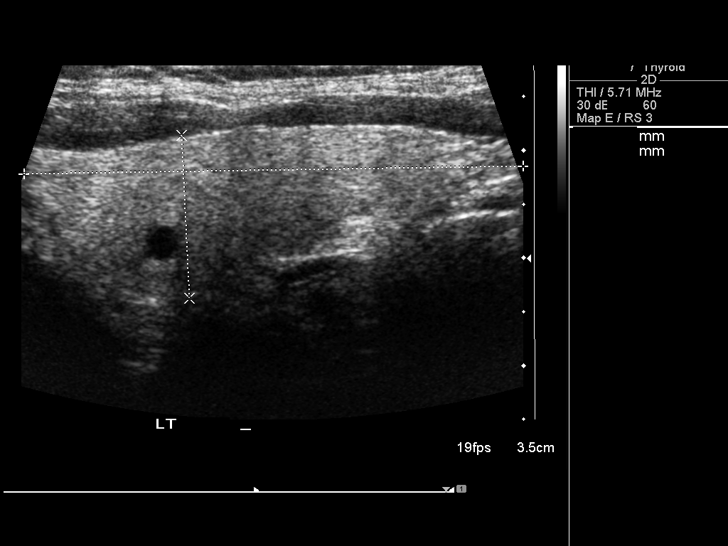
[im 42/56]
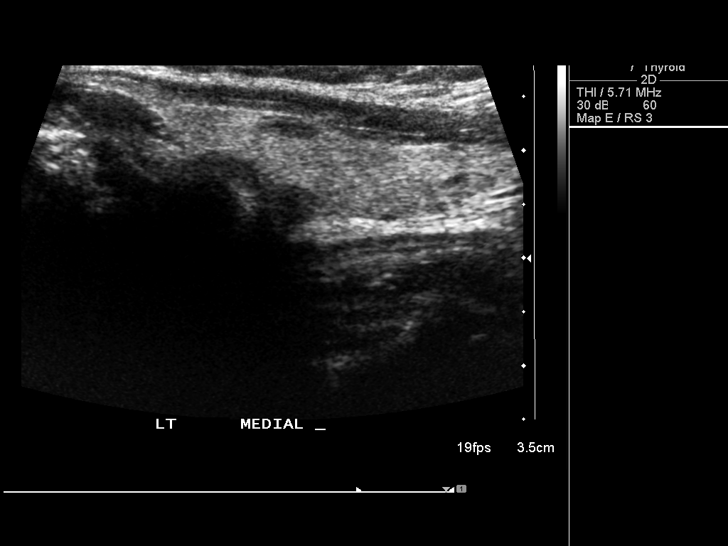
[im 46/56]
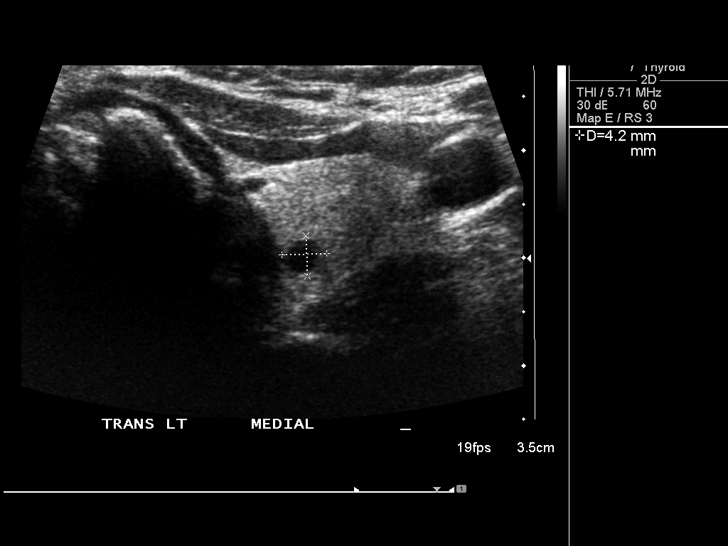
[im 51/56]
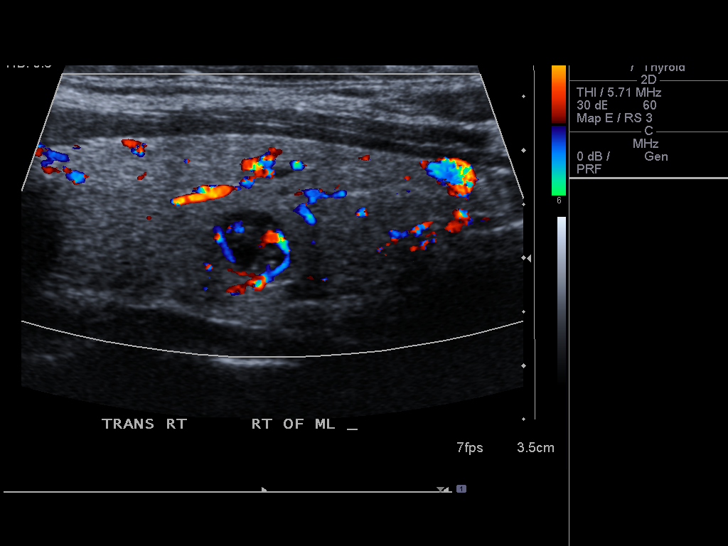
[im 56/56]
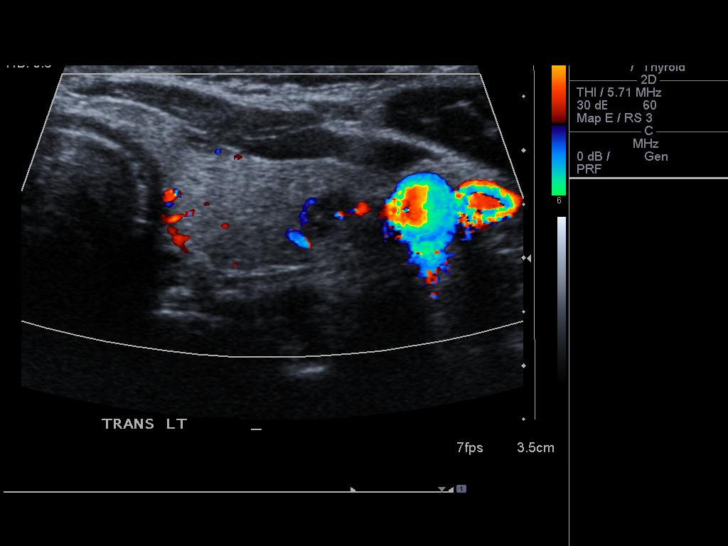

[14 of 25 positions shown; findings below may reference images not displayed]

FINDINGS: Right thyroid lobe

Measurements: 46 x 16 x 19 mm. 9 x 7 x 10 mm hypoechoic nodule,
inferior pole. Additional smaller hypoechoic lesions 5 mm or less
diameter.

Left thyroid lobe

Measurements: 46 x 15 x 21mm. 7 x 5 x 6 mm hypoechoic nodule, mid
lobe. At least 4 additional hypoechoic nodules measuring 6 mm or
less.

Isthmus

Thickness: 4 mm.  No nodules visualized.

Lymphadenopathy

None visualized.
IMPRESSION: 1. Normal-sized thyroid with small bilateral nodules. Findings do
not meet current consensus criteria for biopsy. Follow-up by
clinical exam is recommended. If patient has known risk factors for
thyroid carcinoma, consider follow-up ultrasound in 12 months. If
patient is clinically hyperthyroid, consider nuclear medicine
thyroid uptake and scan. This recommendation follows the consensus
statement: Management of Thyroid Nodules Detected as US: Society of
Radiologists in Ultrasound Consensus Conference Statement. Radiology
